# Patient Record
Sex: Male | Born: 1955 | Hispanic: No | Marital: Single | Smoking: Current every day smoker
Health system: Southern US, Community
[De-identification: ages and names within clinical notes are randomized; demographics above are authoritative.]

## PROBLEM LIST (undated history)

## (undated) DIAGNOSIS — K7460 Unspecified cirrhosis of liver: Secondary | ICD-10-CM

## (undated) DIAGNOSIS — I10 Essential (primary) hypertension: Secondary | ICD-10-CM

## (undated) DIAGNOSIS — I2510 Atherosclerotic heart disease of native coronary artery without angina pectoris: Secondary | ICD-10-CM

## (undated) DIAGNOSIS — E119 Type 2 diabetes mellitus without complications: Secondary | ICD-10-CM

## (undated) DIAGNOSIS — E7800 Pure hypercholesterolemia, unspecified: Secondary | ICD-10-CM

## (undated) DIAGNOSIS — K2270 Barrett's esophagus without dysplasia: Secondary | ICD-10-CM

## (undated) DIAGNOSIS — E039 Hypothyroidism, unspecified: Secondary | ICD-10-CM

---

## 2002-08-20 ENCOUNTER — Emergency Department (HOSPITAL_COMMUNITY): Admission: EM | Admit: 2002-08-20 | Discharge: 2002-08-20 | Payer: Self-pay | Admitting: Emergency Medicine

## 2002-08-25 ENCOUNTER — Emergency Department (HOSPITAL_COMMUNITY): Admission: EM | Admit: 2002-08-25 | Discharge: 2002-08-25 | Payer: Self-pay | Admitting: Emergency Medicine

## 2006-11-27 ENCOUNTER — Ambulatory Visit: Payer: Self-pay | Admitting: Gastroenterology

## 2011-08-24 DIAGNOSIS — H3321 Serous retinal detachment, right eye: Secondary | ICD-10-CM | POA: Insufficient documentation

## 2011-08-30 DIAGNOSIS — I2510 Atherosclerotic heart disease of native coronary artery without angina pectoris: Secondary | ICD-10-CM | POA: Insufficient documentation

## 2011-08-30 DIAGNOSIS — Z9889 Other specified postprocedural states: Secondary | ICD-10-CM | POA: Insufficient documentation

## 2011-08-30 DIAGNOSIS — E039 Hypothyroidism, unspecified: Secondary | ICD-10-CM | POA: Insufficient documentation

## 2011-08-30 DIAGNOSIS — G629 Polyneuropathy, unspecified: Secondary | ICD-10-CM | POA: Insufficient documentation

## 2011-08-30 DIAGNOSIS — G4733 Obstructive sleep apnea (adult) (pediatric): Secondary | ICD-10-CM | POA: Insufficient documentation

## 2011-08-30 DIAGNOSIS — J449 Chronic obstructive pulmonary disease, unspecified: Secondary | ICD-10-CM | POA: Insufficient documentation

## 2013-05-02 DIAGNOSIS — K2270 Barrett's esophagus without dysplasia: Secondary | ICD-10-CM | POA: Insufficient documentation

## 2014-03-11 DIAGNOSIS — K7460 Unspecified cirrhosis of liver: Secondary | ICD-10-CM | POA: Insufficient documentation

## 2014-12-10 DIAGNOSIS — E782 Mixed hyperlipidemia: Secondary | ICD-10-CM | POA: Insufficient documentation

## 2014-12-10 DIAGNOSIS — I252 Old myocardial infarction: Secondary | ICD-10-CM | POA: Insufficient documentation

## 2014-12-10 DIAGNOSIS — I10 Essential (primary) hypertension: Secondary | ICD-10-CM | POA: Insufficient documentation

## 2015-01-06 DIAGNOSIS — D696 Thrombocytopenia, unspecified: Secondary | ICD-10-CM | POA: Insufficient documentation

## 2015-05-24 DIAGNOSIS — F17200 Nicotine dependence, unspecified, uncomplicated: Secondary | ICD-10-CM | POA: Insufficient documentation

## 2015-05-24 DIAGNOSIS — D509 Iron deficiency anemia, unspecified: Secondary | ICD-10-CM | POA: Insufficient documentation

## 2015-06-22 DIAGNOSIS — Z8619 Personal history of other infectious and parasitic diseases: Secondary | ICD-10-CM | POA: Insufficient documentation

## 2015-06-22 DIAGNOSIS — N281 Cyst of kidney, acquired: Secondary | ICD-10-CM | POA: Insufficient documentation

## 2016-04-07 DIAGNOSIS — I776 Arteritis, unspecified: Secondary | ICD-10-CM | POA: Insufficient documentation

## 2016-10-23 DIAGNOSIS — IMO0002 Reserved for concepts with insufficient information to code with codable children: Secondary | ICD-10-CM | POA: Insufficient documentation

## 2016-10-23 DIAGNOSIS — N289 Disorder of kidney and ureter, unspecified: Secondary | ICD-10-CM | POA: Insufficient documentation

## 2016-10-23 DIAGNOSIS — K769 Liver disease, unspecified: Secondary | ICD-10-CM | POA: Insufficient documentation

## 2016-12-06 DIAGNOSIS — Z9114 Patient's other noncompliance with medication regimen: Secondary | ICD-10-CM | POA: Insufficient documentation

## 2017-05-29 DIAGNOSIS — R918 Other nonspecific abnormal finding of lung field: Secondary | ICD-10-CM | POA: Insufficient documentation

## 2017-05-29 DIAGNOSIS — E1129 Type 2 diabetes mellitus with other diabetic kidney complication: Secondary | ICD-10-CM | POA: Insufficient documentation

## 2017-05-29 DIAGNOSIS — R809 Proteinuria, unspecified: Secondary | ICD-10-CM

## 2017-05-29 DIAGNOSIS — E559 Vitamin D deficiency, unspecified: Secondary | ICD-10-CM | POA: Insufficient documentation

## 2017-05-29 DIAGNOSIS — I2541 Coronary artery aneurysm: Secondary | ICD-10-CM | POA: Insufficient documentation

## 2017-05-29 DIAGNOSIS — I2542 Coronary artery dissection: Secondary | ICD-10-CM | POA: Insufficient documentation

## 2017-06-07 DIAGNOSIS — Z961 Presence of intraocular lens: Secondary | ICD-10-CM | POA: Insufficient documentation

## 2017-06-07 DIAGNOSIS — H35372 Puckering of macula, left eye: Secondary | ICD-10-CM | POA: Insufficient documentation

## 2017-06-07 DIAGNOSIS — H25042 Posterior subcapsular polar age-related cataract, left eye: Secondary | ICD-10-CM | POA: Insufficient documentation

## 2017-06-07 DIAGNOSIS — H2512 Age-related nuclear cataract, left eye: Secondary | ICD-10-CM | POA: Insufficient documentation

## 2017-06-07 DIAGNOSIS — H43812 Vitreous degeneration, left eye: Secondary | ICD-10-CM | POA: Insufficient documentation

## 2017-06-07 DIAGNOSIS — Z794 Long term (current) use of insulin: Secondary | ICD-10-CM | POA: Insufficient documentation

## 2017-07-16 DIAGNOSIS — R21 Rash and other nonspecific skin eruption: Secondary | ICD-10-CM | POA: Insufficient documentation

## 2017-07-16 DIAGNOSIS — E1142 Type 2 diabetes mellitus with diabetic polyneuropathy: Secondary | ICD-10-CM | POA: Insufficient documentation

## 2017-07-16 DIAGNOSIS — D61818 Other pancytopenia: Secondary | ICD-10-CM | POA: Insufficient documentation

## 2017-07-30 DIAGNOSIS — D638 Anemia in other chronic diseases classified elsewhere: Secondary | ICD-10-CM | POA: Insufficient documentation

## 2017-12-31 DIAGNOSIS — I8500 Esophageal varices without bleeding: Secondary | ICD-10-CM | POA: Insufficient documentation

## 2017-12-31 DIAGNOSIS — J90 Pleural effusion, not elsewhere classified: Secondary | ICD-10-CM | POA: Insufficient documentation

## 2018-01-22 DIAGNOSIS — F331 Major depressive disorder, recurrent, moderate: Secondary | ICD-10-CM | POA: Insufficient documentation

## 2018-02-25 ENCOUNTER — Encounter (HOSPITAL_BASED_OUTPATIENT_CLINIC_OR_DEPARTMENT_OTHER): Payer: Self-pay | Admitting: Emergency Medicine

## 2018-02-25 ENCOUNTER — Other Ambulatory Visit: Payer: Self-pay

## 2018-02-25 ENCOUNTER — Emergency Department (HOSPITAL_BASED_OUTPATIENT_CLINIC_OR_DEPARTMENT_OTHER)
Admission: EM | Admit: 2018-02-25 | Discharge: 2018-02-25 | Disposition: A | Discharge status: Home / Self Care | Payer: Medicare Other | Attending: Emergency Medicine | Admitting: Emergency Medicine

## 2018-02-25 ENCOUNTER — Emergency Department (HOSPITAL_BASED_OUTPATIENT_CLINIC_OR_DEPARTMENT_OTHER): Payer: Medicare Other

## 2018-02-25 DIAGNOSIS — E119 Type 2 diabetes mellitus without complications: Secondary | ICD-10-CM | POA: Insufficient documentation

## 2018-02-25 DIAGNOSIS — I10 Essential (primary) hypertension: Secondary | ICD-10-CM | POA: Insufficient documentation

## 2018-02-25 DIAGNOSIS — R1084 Generalized abdominal pain: Secondary | ICD-10-CM | POA: Diagnosis not present

## 2018-02-25 DIAGNOSIS — R197 Diarrhea, unspecified: Secondary | ICD-10-CM

## 2018-02-25 DIAGNOSIS — R109 Unspecified abdominal pain: Secondary | ICD-10-CM

## 2018-02-25 DIAGNOSIS — E039 Hypothyroidism, unspecified: Secondary | ICD-10-CM | POA: Diagnosis not present

## 2018-02-25 DIAGNOSIS — F17210 Nicotine dependence, cigarettes, uncomplicated: Secondary | ICD-10-CM | POA: Insufficient documentation

## 2018-02-25 HISTORY — DX: Atherosclerotic heart disease of native coronary artery without angina pectoris: I25.10

## 2018-02-25 HISTORY — DX: Hypothyroidism, unspecified: E03.9

## 2018-02-25 HISTORY — DX: Essential (primary) hypertension: I10

## 2018-02-25 HISTORY — DX: Unspecified cirrhosis of liver: K74.60

## 2018-02-25 HISTORY — DX: Pure hypercholesterolemia, unspecified: E78.00

## 2018-02-25 HISTORY — DX: Barrett's esophagus without dysplasia: K22.70

## 2018-02-25 HISTORY — DX: Type 2 diabetes mellitus without complications: E11.9

## 2018-02-25 LAB — URINALYSIS, ROUTINE W REFLEX MICROSCOPIC
BILIRUBIN URINE: NEGATIVE
Glucose, UA: >=500 mg/dL — AB
HGB URINE DIPSTICK: NEGATIVE
KETONES UR: 15 mg/dL — AB
Leukocytes, UA: NEGATIVE
Nitrite: NEGATIVE
PH: 5.5 (ref 5.0–8.0)
Protein, ur: NEGATIVE mg/dL
Specific Gravity, Urine: 1.025 (ref 1.005–1.030)

## 2018-02-25 LAB — CBC
HEMATOCRIT: 34.8 % — AB (ref 39.0–52.0)
HEMOGLOBIN: 10.3 g/dL — AB (ref 13.0–17.0)
MCH: 26.3 pg (ref 26.0–34.0)
MCHC: 29.6 g/dL — AB (ref 30.0–36.0)
MCV: 89 fL (ref 80.0–100.0)
Platelets: 80 10*3/uL — ABNORMAL LOW (ref 150–400)
RBC: 3.91 MIL/uL — ABNORMAL LOW (ref 4.22–5.81)
RDW: 17.9 % — AB (ref 11.5–15.5)
WBC: 5 10*3/uL (ref 4.0–10.5)
nRBC: 0 % (ref 0.0–0.2)

## 2018-02-25 LAB — COMPREHENSIVE METABOLIC PANEL
ALBUMIN: 3.8 g/dL (ref 3.5–5.0)
ALT: 26 U/L (ref 0–44)
AST: 32 U/L (ref 15–41)
Alkaline Phosphatase: 84 U/L (ref 38–126)
Anion gap: 7 (ref 5–15)
BUN: 22 mg/dL (ref 8–23)
CO2: 23 mmol/L (ref 22–32)
CREATININE: 0.99 mg/dL (ref 0.61–1.24)
Calcium: 8.6 mg/dL — ABNORMAL LOW (ref 8.9–10.3)
Chloride: 106 mmol/L (ref 98–111)
GFR calc Af Amer: >60 mL/min (ref >60)
GFR calc non Af Amer: >60 mL/min (ref >60)
GLUCOSE: 265 mg/dL — AB (ref 70–99)
POTASSIUM: 3.6 mmol/L (ref 3.5–5.1)
Sodium: 136 mmol/L (ref 135–145)
TOTAL PROTEIN: 7 g/dL (ref 6.5–8.1)
Total Bilirubin: 0.4 mg/dL (ref 0.3–1.2)

## 2018-02-25 LAB — URINALYSIS, MICROSCOPIC (REFLEX)
RBC / HPF: NONE SEEN RBC/hpf (ref 0–5)
WBC, UA: NONE SEEN WBC/hpf (ref 0–5)

## 2018-02-25 LAB — LIPASE, BLOOD: Lipase: 53 U/L — ABNORMAL HIGH (ref 11–51)

## 2018-02-25 MED ORDER — SODIUM CHLORIDE 0.9 % IV BOLUS
500.0000 mL | Freq: Once | INTRAVENOUS | Status: AC
  Administered 2018-02-25: 500 mL via INTRAVENOUS

## 2018-02-25 NOTE — ED Notes (Signed)
ED Provider at bedside. 

## 2018-02-25 NOTE — ED Provider Notes (Signed)
[facility] Emergency Department Provider Note MRN:  [id number]  Arrival date & time: 02/25/18     Chief Complaint   Diarrhea   History of Present Illness   Lawrence Palmer is a 62 y.o. year-old male with a history of CAD, diabetes, cirrhosis presenting to the ED with chief complaint of diarrhea.  Patient explains that he was diagnosed with C. difficile as the cause of his diarrhea 3 weeks ago.  He completed a 10-day course of oral antibiotics 4 days ago.  Yesterday, his stools became more watery, his abdominal pain became a bit worse.  He explains that he is had continued abdominal discomfort and diarrhea despite antibiotics.  Pain is currently mild to moderate in severity, constant, generalized.  States that he is beginning to feel dehydrated.  Denies headache or vision change, no chest pain or shortness of breath.  Review of Systems  A complete 10 system review of systems was obtained and all systems are negative except as noted in the HPI and PMH.   Patient's Health History    Past Medical History:  Diagnosis Date  . Barrett's esophagus   . Cirrhosis of liver (HCC)   . Coronary artery disease   . Diabetes mellitus without complication (HCC)   . Hypercholesteremia   . Hypertension   . Hypothyroidism     History reviewed. No pertinent surgical history.  No family history on file.  Social History   Socioeconomic History  . Marital status: Single    Spouse name: Not on file  . Number of children: Not on file  . Years of education: Not on file  . Highest education level: Not on file  Occupational History  . Not on file  Social Needs  . Financial resource strain: Not on file  . Food insecurity:    Worry: Not on file    Inability: Not on file  . Transportation needs:    Medical: Not on file    Non-medical: Not on file  Tobacco Use  . Smoking status: Current Every Day Smoker  . Smokeless tobacco: Never Used  Substance and Sexual Activity  .  Alcohol use: Not on file  . Drug use: Not on file  . Sexual activity: Not on file  Lifestyle  . Physical activity:    Days per week: Not on file    Minutes per session: Not on file  . Stress: Not on file  Relationships  . Social connections:    Talks on phone: Not on file    Gets together: Not on file    Attends religious service: Not on file    Active member of club or organization: Not on file    Attends meetings of clubs or organizations: Not on file    Relationship status: Not on file  . Intimate partner violence:    Fear of current or ex partner: Not on file    Emotionally abused: Not on file    Physically abused: Not on file    Forced sexual activity: Not on file  Other Topics Concern  . Not on file  Social History Narrative  . Not on file     Physical Exam  Vital Signs and Nursing Notes reviewed Vitals:   02/25/18 1603 02/25/18 1920  BP: (!) 185/98 (!) 192/97  Pulse: 84 78  Resp: 18 20  Temp: 98.4 F (36.9 C) 98.3 F (36.8 C)  SpO2: 95% 96%    CONSTITUTIONAL: Well-appearing, NAD NEURO:  Alert and  oriented x 3, no focal deficits EYES:  eyes equal and reactive ENT/NECK:  no LAD, no JVD CARDIO: Regular rate, well-perfused, normal S1 and S2 PULM:  CTAB no wheezing or rhonchi GI/GU:  normal bowel sounds, non-distended, non-tender MSK/SPINE:  No gross deformities, no edema SKIN:  no rash, atraumatic PSYCH:  Appropriate speech and behavior  Diagnostic and Interventional Summary    Labs Reviewed  LIPASE, BLOOD - Abnormal; Notable for the following components:      Result Value   Lipase 53 (*)    All other components within normal limits  COMPREHENSIVE METABOLIC PANEL - Abnormal; Notable for the following components:   Glucose, Bld 265 (*)    Calcium 8.6 (*)    All other components within normal limits  CBC - Abnormal; Notable for the following components:   RBC 3.91 (*)    Hemoglobin 10.3 (*)    HCT 34.8 (*)    MCHC 29.6 (*)    RDW 17.9 (*)     Platelets 80 (*)    All other components within normal limits  URINALYSIS, ROUTINE W REFLEX MICROSCOPIC - Abnormal; Notable for the following components:   Glucose, UA >=500 (*)    Ketones, ur 15 (*)    All other components within normal limits  URINALYSIS, MICROSCOPIC (REFLEX) - Abnormal; Notable for the following components:   Bacteria, UA RARE (*)    All other components within normal limits    DG Abd Acute W/Chest  Final Result      Medications  sodium chloride 0.9 % bolus 500 mL ( Intravenous Stopped 02/25/18 1810)     Procedures Critical Care  ED Course and Medical Decision Making  I have reviewed the triage vital signs and the nursing notes.  Pertinent labs & imaging results that were available during my care of the patient were reviewed by me and considered in my medical decision making (see below for details).  Continued diarrhea in this 62 year old male with history of recently diagnosed C. difficile.  Abdomen is mildly distended, largely nontender.  Vital signs are stable, afebrile.  Concern for treatment failure, less concerned for significant C. difficile colitis or toxic megacolon, will screen with plain film, labs pending.  Labs reassuring, no leukocytosis, plain film with no distended loops of bowel, abdominal exam continues to be reassuring.  Patient feeling much better after IV fluids, eating and drinking here in the ED.  Patient comfortable with close outpatient follow-up, has follow-up in place, will discuss need for restarting antibiotics with PCP.  After the discussed management above, the patient was determined to be safe for discharge.  The patient was in agreement with this plan and all questions regarding their care were answered.  ED return precautions were discussed and the patient will return to the ED with any significant worsening of condition.  Elmer SowMichael M. Pilar PlateBero, MD [facility] [email or web address]  Final  Clinical Impressions(s) / ED Diagnoses     ICD-10-CM   1. Diarrhea of presumed infectious origin R19.7   2. Abdominal pain R10.9 DG Abd Acute W/Chest    DG Abd Acute W/Chest    ED Discharge Orders    None         Sabas SousBero, Ariz Terrones M, MD 02/25/18 2001

## 2018-02-25 NOTE — Discharge Instructions (Addendum)
You were evaluated in the Emergency Department and after careful evaluation, we did not find any emergent condition requiring admission or further testing in the hospital.  It is important that you call your regular doctors as soon as possible to discuss your symptoms.  They may want to restart you more antibiotics.  Please return to the Emergency Department if you experience any worsening of your condition.  We encourage you to follow up with a primary care provider.  Thank you for allowing us to be a part of your care.

## 2018-02-25 NOTE — ED Triage Notes (Signed)
Diarrhea x 3 weeks.  Pt had c-diff.  Treated with antibiotics but still having diarrhea.  Pt feels weak.

## 2019-02-11 IMAGING — CR DG ABDOMEN ACUTE W/ 1V CHEST
4 series · 4 of 4 positions shown · non-contrast
Comparison: Chest x-ray dated December 18, 2017. CT abdomen pelvis
dated December 10, 2017.

CLINICAL DATA: Abdominal pain and diarrhea for the past 3 weeks.

EXAM:
DG ABDOMEN ACUTE W/ 1V CHEST

[w chest pa]
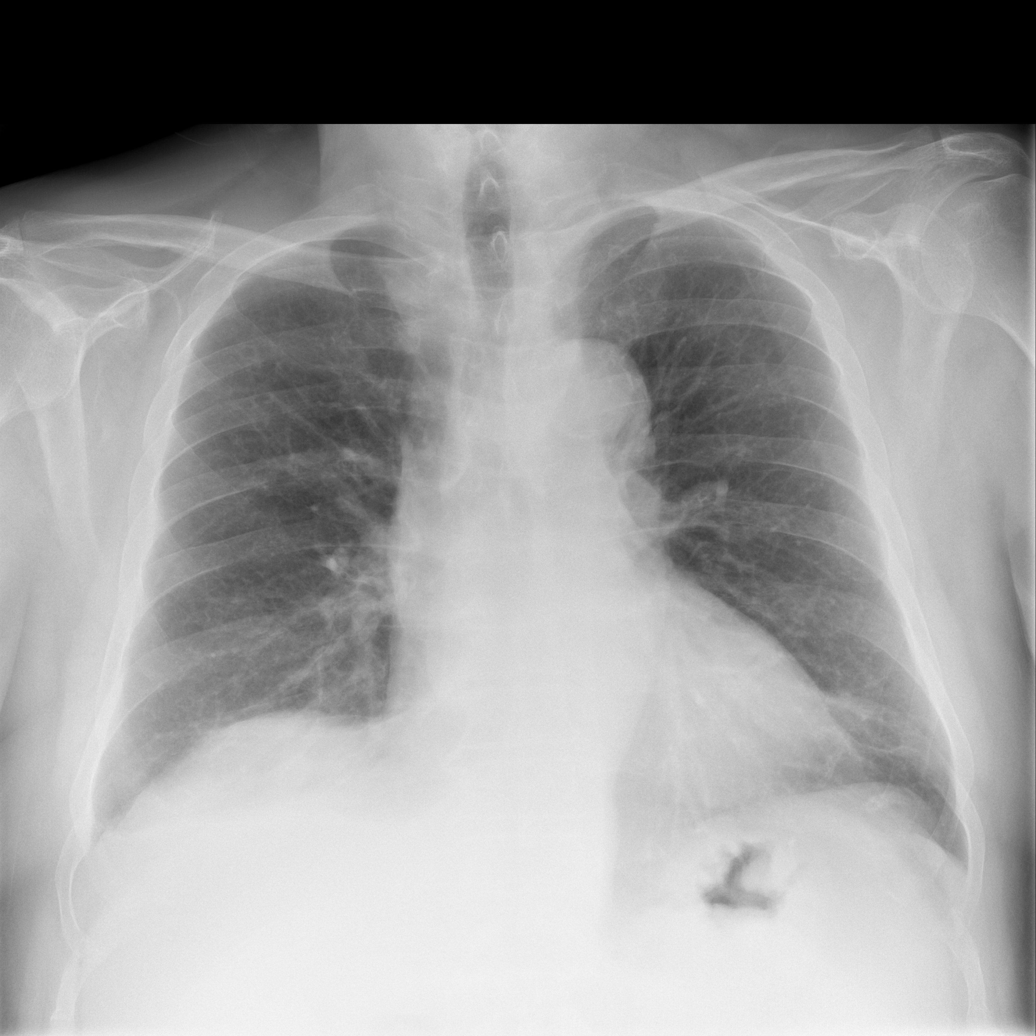

[w abdomen upright]
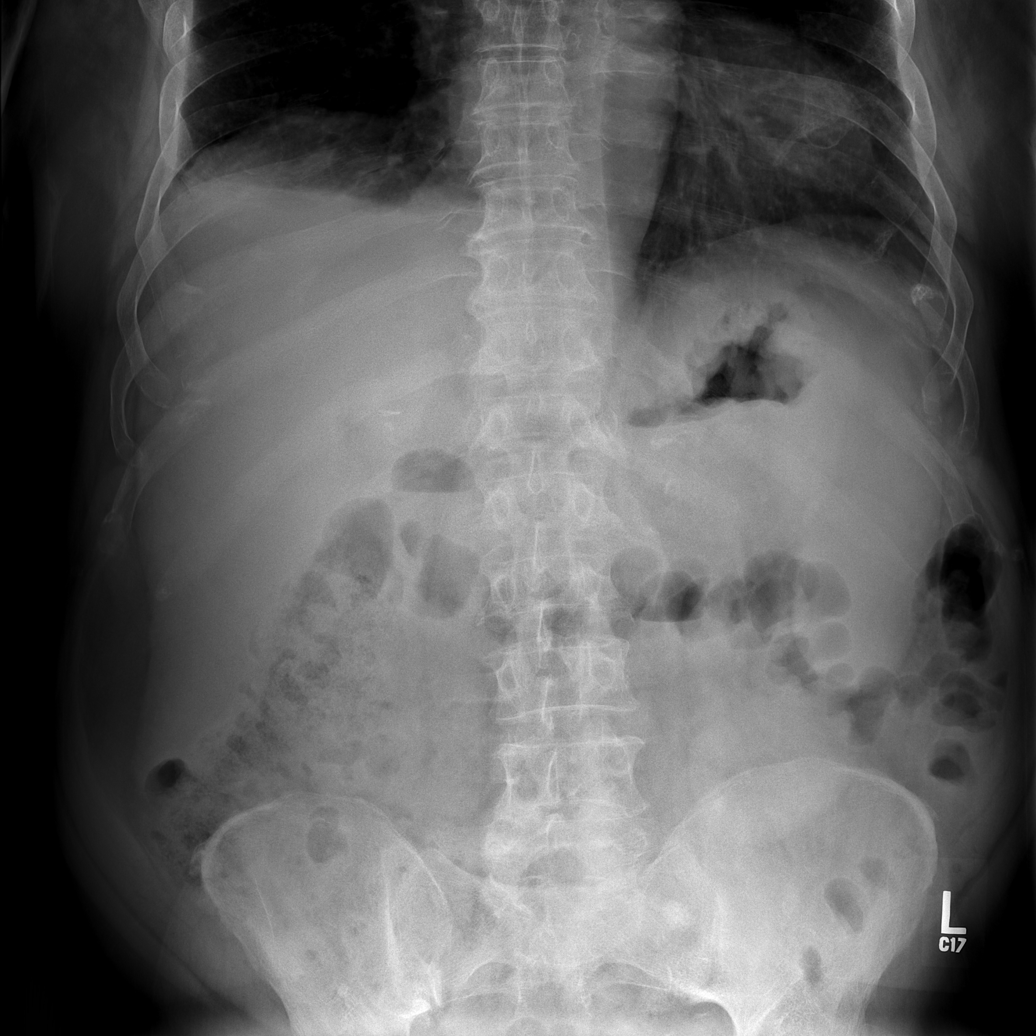

[t abdomen supine (1 of 2)]
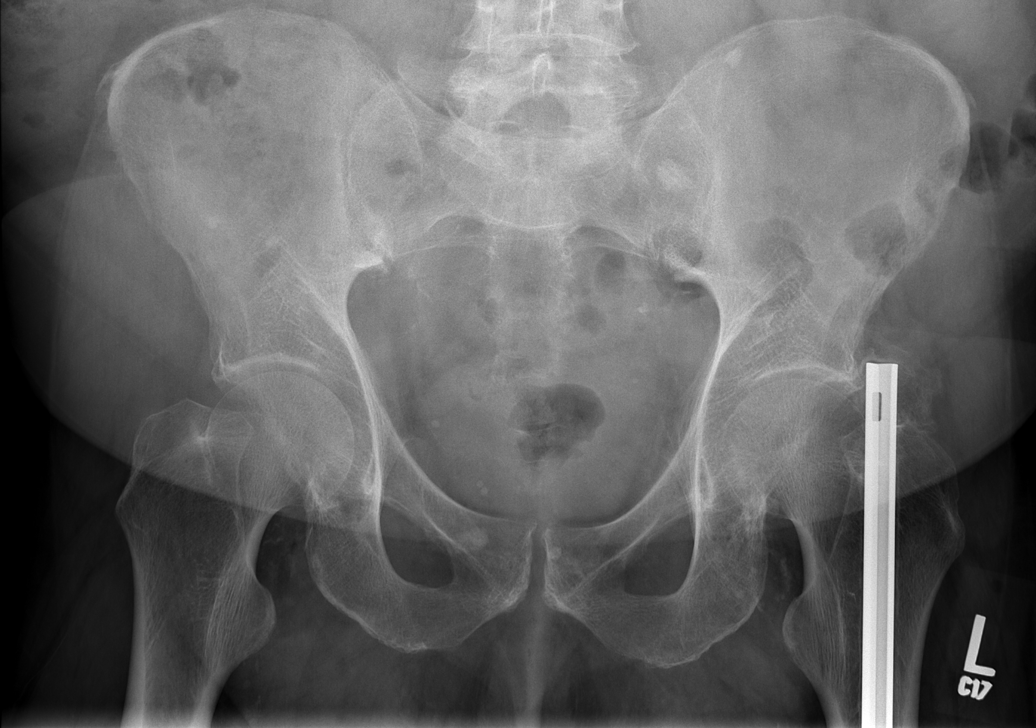

[t abdomen supine (2 of 2)]
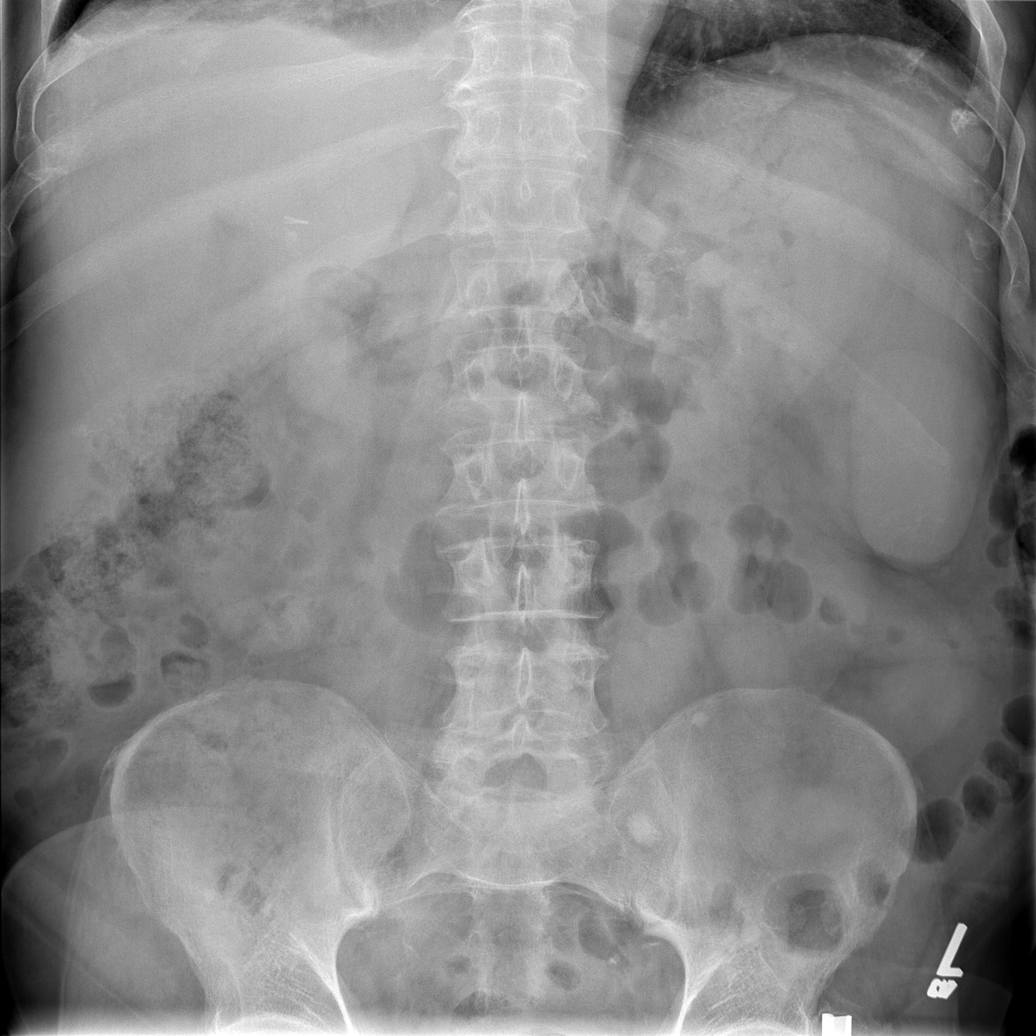

[4 of 4 positions shown; findings below may reference images not displayed]

FINDINGS: There is no evidence of dilated bowel loops or free intraperitoneal
air. No radiopaque calculi. Splenomegaly. Prior cholecystectomy.

Heart size remains at the upper limits of normal in size. Normal
mediastinal contours. Both lungs are clear.

No acute osseous abnormality. Unchanged bone islands in the left
sacrum and left ilium. Unchanged left femur intramedullary rod.
IMPRESSION: 1. No acute abdominal findings.
2. Splenomegaly.
3. No acute cardiopulmonary disease.

## 2021-10-17 ENCOUNTER — Other Ambulatory Visit: Payer: Self-pay | Admitting: Physician Assistant

## 2021-10-17 DIAGNOSIS — K7469 Other cirrhosis of liver: Secondary | ICD-10-CM

## 2021-10-19 ENCOUNTER — Encounter: Payer: Self-pay | Admitting: *Deleted

## 2021-10-19 ENCOUNTER — Ambulatory Visit
Admission: RE | Admit: 2021-10-19 | Discharge: 2021-10-19 | Disposition: A | Discharge status: Home / Self Care | Payer: Medicare Other | Source: Ambulatory Visit | Attending: Physician Assistant | Admitting: Physician Assistant

## 2021-10-19 DIAGNOSIS — K7469 Other cirrhosis of liver: Secondary | ICD-10-CM

## 2021-10-19 HISTORY — PX: IR RADIOLOGIST EVAL & MGMT: IMG5224

## 2021-10-19 NOTE — Consult Note (Signed)
Chief Complaint: Patient was consulted remotely today (TeleHealth) for Portal Hypertension  Referring Physician(s): Izaj,Nicholas  History of Present Illness: Lawrence Palmer is a 66 y.o. 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    His most recent EGD from 09/25/21 showed fresh and old blood in the distal esophagus, stomach, and duodenum.  Large esophageal varices with red Wale signs, which were banded. EGD on 05/05/19 showed evidence of portal gastropathy.  He required paracentesis on 10/14/2021 and 09/30/2021.  He is currently on Lasix and Spironolactone.   He recently presented to the ED with chest pain and shortness of breath on 10/16/2021.  Work up was negative for MI and most likely was caused by fluid overload related to liver failure.  His chest pain is improved today.  He has mild persistent right upper quadrant abdominal pain.  He denies nausea.  He had coffee ground emesis 2-3 weeks ago.  He had a bloody bowel movement about 1 week ago.  Past Medical History:   CAD (s/p 7 stents)   Hypothyroidism   Obstructive sleep apnea syndrome   Retinal detachment - Right Eye   Thrombocytopenia (HCC)   Iron deficiency anemia   Former smoker   Mixed hyperlipidemia   Type 2 diabetes mellitus with diabetic polyneuropathy, with long-term current use of insulin (HCC)   History of hepatitis C virus infection   Chronic GERD   Hep C induced cirrhosis (HCC)   Poor compliance with medication   Type 2 diabetes mellitus with microalbuminuria, with long-term current use of insulin (HCC)   Vitamin D deficiency   Pulmonary nodules -stable based on multiple CTs   Obesity (BMI 30.0-34.9)   Hypertension associated with diabetes (HCC)   Epiretinal membrane (ERM) of left eye   Nuclear sclerotic cataract  of left eye   Posterior subcapsular age-related cataract of left eye   PVD (posterior vitreous detachment), left   Astigmatism with presbyopia, bilateral   Pseudophakia of right eye   Pancytopenia (HCC)   Anemia   Esophageal varices determined by endoscopy (HCC)   Raised antibody titer   History of skin cancer in adulthood   Vasculitis (HCC)   Moderate episode of recurrent major depressive disorder (HCC)   Iron deficiency   Thoracic aortic aneurysm without rupture (HCC) 4.2 cm   Portal hypertensive gastropathy (HCC)   COPD, severe (HCC)   Shortness of breath   Pulmonary emphysema (HCC)   History of pleural effusion   Current mild episode of major depressive disorder (HCC)   Abdominal aortic aneurysm (AAA) without rupture   Arthropathy of cervical facet joint   Chronic buttock pain   Bilateral occipital neuralgia   Chronic kidney disease, stage 3a (HCC)   Anginal equivalent (HCC)   Hyperglycemia due to diabetes mellitus (HCC)   GI bleed    Past Surgical History:  BASAL CELL CARCINOMA EXCISION       CARDIAC CATHETERIZATION   11/13/07 (x 7 stents)   CATARACT EXTRACTION W/  INTRAOCULAR LENS IMPLANT Right 10/08/2012   CERVICAL MEDIAL BRANCH BLOCK Bilateral 05/19/2020   CHOLECYSTECTOMY       COLONOSCOPY N/A 03/26/2019   ENTEROSCOPY N/A 11/27/2017   ESOPHAGOGASTRODUODENOSCOPY   09/02/2020   ESOPHAGOSCOPY / EGD N/A 05/28/2018   ESOPHAGOSCOPY / EGD N/A 09/27/2018   ESOPHAGOSCOPY / EGD N/A 03/26/2019  ESOPHAGOSCOPY / EGD N/A 05/05/2019   HIATAL HERNIA REPAIR       INCISION AND DRAINAGE OF WOUND   2002   KIDNEY STONE SURGERY       LUNG DECORTICATION Right 06/16/2019   MOHS SURGERY       OCCIPITAL NERVE BLOCK Bilateral 01/14/2021   ORIF FEMUR FRACTURE   1976   PARS PLANA VITRECTOMY   08/30/2011   RADIOFREQUENCY ABLATION NERVES Right 08/11/2020   RETINAL DETACHMENT SURGERY Bilateral     SACROILIAC JOINT INJECTION Bilateral 11/24/2020   SCLERAL BUCKLE   08/30/2011     Allergies: Atropine and Lemon oil  Medications:  Current Outpatient Medications:    albuterol 2.5 mg /3 mL (0.083 %) nebulizer solution, Take 3 mLs (2.5 mg total) by nebulization every 4 (four) hours as needed for Wheezing or Shortness of Breath., Disp: 180 mL, Rfl: 0   albuterol 90 mcg/actuation inhaler, Inhale 2 puffs into the lungs every 6 (six) hours as needed for Wheezing., Disp: 3 each, Rfl: 3   amLODIPine (NORVASC) 10 MG tablet, Take 1 tablet (10 mg total) by mouth daily. HOLD UNTIL PCP FOLLOW UP BECAUSE OF LOW BLOOD PRESSURE DURING HOSPITAL ADMISSION, Disp: 90 tablet, Rfl: 3   aspirin 81 MG EC tablet *ANTIPLATELET*, Take 1 tablet (81 mg total) by mouth daily., Disp: , Rfl:    atorvastatin (LIPITOR) 10 MG tablet, TAKE 1 TABLET(10 MG) BY MOUTH DAILY, Disp: 90 tablet, Rfl: 1   BD ULTRA-FINE SHORT PEN NEEDLE 31 gauge x 5/16" Ndle, USE TO INJECT INTO THE SKIN FOUR TIMES DAILY, Disp: 200 each, Rfl: 3   blood sugar diagnostic (ONETOUCH ULTRA TEST) Strp test strips, CHECK BLOOD SUGAR FOUR TIMES DAILY, Disp: 360 strip, Rfl: 1   buPROPion SR (WELLBUTRIN SR) 100 MG 12 hr tablet, TAKE 1 TABLET(100 MG) BY MOUTH TWICE DAILY, Disp: 180 tablet, Rfl: 1   carvediloL (COREG) 6.25 MG tablet, Take 1 tablet (6.25 mg total) by mouth 2 times daily., Disp: 60 tablet, Rfl: 0   cholecalciferol, vitamin D3, 25 mcg (1,000 unit) capsule, Take 1 capsule (1,000 Units total) by mouth daily., Disp: , Rfl:    ferrous sulfate (FERATAB) 325 (65 FE) MG tablet, Take 1 tablet (325 mg total) by mouth daily with breakfast., Disp: , Rfl:    fluticasone propionate (FLONASE) 50 mcg/actuation nasal spray, Administer 2 sprays in each nostril daily as needed for Rhinitis., Disp: , Rfl:    fluticasone-umeclidin-vilanter (TRELEGY ELLIPTA) 100-62.5-25 mcg inhaler, Inhale 1 puff into the lungs daily., Disp: 3 each, Rfl: 3   folic acid (FOLVITE) 1 MG tablet, TAKE 1 TABLET(1 MG) BY MOUTH DAILY, Disp: 90 tablet, Rfl: 1   furosemide  (LASIX) 40 MG tablet, Take 1 tablet (40 mg total) by mouth daily., Disp: 60 tablet, Rfl: 2   hydrALAZINE (APRESOLINE) 25 MG tablet, TAKE 1 TABLET(25 MG) BY MOUTH THREE TIMES DAILY  HOLD UNTIL PCP FOLLOW UP BECAUSE OF LOW BLOOD PRESSURE DURING HOSPITAL ADMISSION, Disp: 90 tablet, Rfl: 5   hydrocortisone 2.5 % cream, Apply thin layer to affected areas twice daily as needed, Disp: 28 g, Rfl: 1   insulin lispro (HUMALOG KWIKPEN) 100 unit/mL injection, Inject 22-38 Units into the skin 3 times daily before meals. According to sliding scale at home. MDD 100units, Disp: , Rfl:    insulin syringe-needle U-100 1/2 mL 31 gauge x 15/64" Syrg, 1 Syringe by Misc.(Non-Drug; Combo Route) route daily., Disp: 100 Syringe, Rfl: 1   isosorbide mononitrate ER (IMDUR ER) 30  MG 24 hr tablet, TAKE 3 TABLETS(90 MG) BY MOUTH DAILY, Disp: 270 tablet, Rfl: 0   LACTULOSE ORAL, Take 1 Dose by mouth Ad Lib., Disp: , Rfl:    loratadine (CLARITIN) 10 mg tablet, Take 1 tablet (10 mg total) by mouth every morning., Disp: 90 tablet, Rfl: 2   LORazepam (ATIVAN) 1 MG tablet, TAKE 1 AND 1/2 TABLETS BY MOUTH EVERY NIGHT AT BEDTIME AS NEEDED FOR ANXIETY, Disp: 45 tablet, Rfl: 2   losartan (COZAAR) 100 MG tablet, Take 1 tablet (100 mg total) by mouth daily. HOLD UNTIL PCP FOLLOW UP BECAUSE OF LOW BLOOD PRESSURE DURING HOSPITAL ADMISSION, Disp: 90 tablet, Rfl: 0   magnesium oxide (MAGOX) 400 mg (241.3 mg magnesium) tablet, Take 1 tablet (400 mg total) by mouth daily., Disp: , Rfl:    melatonin 10 mg Tab, Take 10 mg by mouth nightly as needed.              , Disp: , Rfl:    nitroglycerin (NITROSTAT) 0.4 MG SL tablet, DISSOLVE 1 TABLET UNDER THE TONGUE EVERY 5 MINUTES FOR UP TO 3 DOSES AS NEEDED FOR CHEST PAIN, IF NO RELIEF AFTER 3 DOSES CALL 911 (Patient taking differently: DISSOLVE 1 TABLET UNDER THE TONGUE EVERY 5 MINUTES FOR UP TO 3 DOSES AS NEEDED FOR CHEST PAIN, IF NO RELIEF AFTER 3 DOSES CALL 911  Last use 08/24/20), Disp: 275 tablet, Rfl:  0   pantoprazole (PROTONIX) 40 MG tablet, TAKE 1 TABLET(40 MG) BY MOUTH TWICE DAILY, Disp: 180 tablet, Rfl: 1   potassium chloride ER (KLOR-CON-M, K-DUR) 20 MEQ extended release tablet, TAKE 1 TABLET(20 MEQ) BY MOUTH TWICE DAILY, Disp: 180 tablet, Rfl: 2   semaglutide (OZEMPIC) 0.25 mg or 0.5 mg(2 mg/1.5 mL) injection, Inject 0.4 mLs (0.5 mg total) into the skin once a week., Disp: 1.5 mL, Rfl: 3   spironolactone (ALDACTONE) 100 MG tablet, TAKE 1 TABLET(100 MG) BY MOUTH DAILY, Disp: 90 tablet, Rfl: 2   traMADoL (ULTRAM) 50 mg tablet, Take 1 tablet (50 mg total) by mouth every 6 (six) hours as needed for Pain., Disp: 15 tablet, Rfl: 0   gabapentin (NEURONTIN) 300 MG capsule, Take 2 capsules (600 mg total) by mouth 3 times daily., Disp: 180 capsule, Rfl: 2   insulin NPH (HUMULIN N, NOVOLIN N) 100 unit/mL injection, Inject 44 Units into the skin nightly. (Patient taking differently: Inject 75 Units into the skin 2 times daily with meals.), Disp: 40 mL, Rfl: 3   levothyroxine (SYNTHROID) 125 MCG tablet, Take 2 tablets (250 mcg total) by mouth Daily at 0600., Disp: 180 tablet, Rfl: 3  Family History: Positive for colon cancer.  Social History:  Tobacco Use  Smoking Status Former   Packs/day: 2.00   Years: 54.00   Total pack years: 108.00   Types: Cigars, Cigarettes   Start date: 1968   Quit date: 03/30/2018   Years since quitting: 3.5  Smokeless Tobacco Never  Tobacco Comments    2022-per 2020 LS form/chart(2)    Substance and Sexual Activity  Alcohol Use Not Currently   Drug Use Never   Review of Systems  Review of Systems: A 12 point ROS discussed and pertinent positives are indicated in the HPI above.  All other systems are negative.  Advance Care Plan: The advanced care plan/surrogate decision maker was discussed at the time of visit and/or is documented in the medical record.   Physical Exam No direct physical exam was performed (except for noted visual exam  findings with  Video Visits).    Vital Signs: There were no vitals taken for this visit.  Imaging: No results found.  Labs: 10/16/2021:  INR: 1.26 Na: 135 Cr: 1.04 Albumin: 3.4 Total Bilirubin: 0.8 AST: 20 ALT: 12 GFR: 79  Assessment and Plan:  66 year old gentleman with extensive past medical history including HCV Cirrhosis and Portal Hypertension in the form of recurrent ascites, portal gastropathy, and esophageal varices.  Most recent EGD shows evidence of bleeding esophageal varices.  He has required repeated paracentesis despite diuretic treatment.  His current MELD score is 9.  He is an appropriate candidate for TIPS with BRTO/varix embolization.  Prior to TIPS, the patient will require an ECHO to determine right heart pressures and CTA of the abdomen and pelvis to evaluate Portal anatomy.  Risks and benefits of TIPS, BRTO and/or additional variceal embolization were discussed with the patient including, but not limited to, infection, bleeding, damage to adjacent structures, worsening hepatic and/or cardiac function, worsening and/or the development of altered mental status/encephalopathy, non-target embolization and death.   All of the patient's questions were answered, patient is agreeable to proceed.  Consent signed and in chart.  Plan: - Follow up on Echo results (scheduled 10/19/2021) - Follow up on CTA abdomen pelvis (scheduled 10/21/2021) - If Echo and CTA abdomen pelvis are appropriate, proceed with TIPS with BRTO/variceal embolization at [facility].  Thank you for this interesting consult.  I greatly enjoyed meeting Deroy Noah and look forward to participating in their care.  A copy of this report was sent to the requesting provider on this date.  Electronically Signed: Al Corpus Maddon Horton 10/19/2021, 12:09 PM   I spent a total of  30 Minutes in remote  clinical consultation, greater than 50% of which was counseling/coordinating care for Portal Hypertension.     Visit type: Audio only (telephone). Audio (no video) only due to video not being required for this appointment. Alternative for in-person consultation at [facility], [address] [place], [place], [place]. This visit type was conducted due to national recommendations for restrictions regarding the COVID-19 Pandemic (e.g. social distancing).  This format is felt to be most appropriate for this patient at this time.  All issues noted in this document were discussed and addressed.

## 2021-11-01 ENCOUNTER — Other Ambulatory Visit: Payer: Self-pay | Admitting: Interventional Radiology

## 2021-11-25 ENCOUNTER — Telehealth (HOSPITAL_COMMUNITY): Payer: Self-pay | Admitting: Student

## 2021-11-25 DIAGNOSIS — R188 Other ascites: Secondary | ICD-10-CM

## 2021-11-25 NOTE — Telephone Encounter (Signed)
Patient is s/p TIPS procedure with Dr. Bryn Gulling 11/24/21 at [facility]. He was admitted by the [facility] hospitalist service for overnight observation. He was discharged home 11/25/21. He will follow up with Dr. Bryn Gulling in approximately one month. An order has been placed for a scheduler from our office to call him with a date/time. The patient knows to call our office with any questions/concerns prior to his visit. The patient will also be contacted regularly over the next few weeks per the Portal Hypertension Clinic guidelines.  Alwyn Ren, [place] [phone] 11/25/2021, 1:25 PM

## 2021-11-29 ENCOUNTER — Telehealth (HOSPITAL_COMMUNITY): Payer: Self-pay | Admitting: Student

## 2021-11-29 NOTE — Telephone Encounter (Signed)
Patient is s/p TIPS procedure at [facility] 11/24/21 by Dr. Bryn Gulling. Post procedure follow up call made today. Both the home and cell numbers have voice mail boxes that are full. Will try again on another day.  Alwyn Ren, [place] [phone] 11/29/2021, 11:13 AM

## 2021-12-02 ENCOUNTER — Other Ambulatory Visit (HOSPITAL_COMMUNITY): Payer: Self-pay | Admitting: Student

## 2021-12-02 ENCOUNTER — Other Ambulatory Visit: Payer: Self-pay | Admitting: Interventional Radiology

## 2021-12-02 DIAGNOSIS — K7460 Unspecified cirrhosis of liver: Secondary | ICD-10-CM

## 2021-12-28 ENCOUNTER — Other Ambulatory Visit: Payer: Medicare Other

## 2021-12-28 ENCOUNTER — Inpatient Hospital Stay: Admission: RE | Admit: 2021-12-28 | Payer: Medicare Other | Source: Ambulatory Visit

## 2022-08-02 DEATH — deceased
